# Patient Record
Sex: Female | Born: 1990 | Race: White | Hispanic: No | Marital: Married | Smoking: Never smoker
Health system: Southern US, Community
[De-identification: ages and names within clinical notes are randomized; demographics above are authoritative.]

## PROBLEM LIST (undated history)

## (undated) HISTORY — PX: TUBAL LIGATION: SHX77

## (undated) HISTORY — PX: APPENDECTOMY: SHX54

## (undated) HISTORY — PX: DILATION AND CURETTAGE OF UTERUS: SHX78

---

## 2017-08-08 DIAGNOSIS — Z302 Encounter for sterilization: Secondary | ICD-10-CM | POA: Diagnosis not present

## 2017-09-25 ENCOUNTER — Encounter (HOSPITAL_COMMUNITY): Payer: Self-pay | Admitting: *Deleted

## 2017-09-25 ENCOUNTER — Other Ambulatory Visit: Payer: Self-pay

## 2017-09-25 ENCOUNTER — Emergency Department (HOSPITAL_COMMUNITY)
Admission: EM | Admit: 2017-09-25 | Discharge: 2017-09-25 | Disposition: A | Discharge status: Home / Self Care | Payer: Medicaid Other | Attending: Emergency Medicine | Admitting: Emergency Medicine

## 2017-09-25 DIAGNOSIS — Y93G1 Activity, food preparation and clean up: Secondary | ICD-10-CM | POA: Diagnosis not present

## 2017-09-25 DIAGNOSIS — W260XXA Contact with knife, initial encounter: Secondary | ICD-10-CM | POA: Diagnosis not present

## 2017-09-25 DIAGNOSIS — S61215A Laceration without foreign body of left ring finger without damage to nail, initial encounter: Secondary | ICD-10-CM | POA: Diagnosis present

## 2017-09-25 DIAGNOSIS — Y92009 Unspecified place in unspecified non-institutional (private) residence as the place of occurrence of the external cause: Secondary | ICD-10-CM | POA: Insufficient documentation

## 2017-09-25 DIAGNOSIS — Y999 Unspecified external cause status: Secondary | ICD-10-CM | POA: Insufficient documentation

## 2017-09-25 MED ORDER — POVIDONE-IODINE 10 % EX SOLN
CUTANEOUS | Status: AC
  Filled 2017-09-25: qty 15

## 2017-09-25 MED ORDER — LIDOCAINE HCL (PF) 2 % IJ SOLN: 2.0000 mL | Freq: Once | INTRAMUSCULAR | Status: DC

## 2017-09-25 MED ORDER — LIDOCAINE HCL (PF) 2 % IJ SOLN
INTRAMUSCULAR | Status: AC
  Filled 2017-09-25: qty 20

## 2017-09-25 NOTE — ED Triage Notes (Signed)
Pt reports opening a package of meat with a knife at home and cut her left ring finger. Pt has a small 1/2 inch cut to that finger.

## 2017-09-25 NOTE — ED Notes (Signed)
telfa and kerlix applied to wound; pt instructed on how to keep clean and dry

## 2017-09-25 NOTE — Discharge Instructions (Signed)
Have your sutures removed in 10 days.  Keep your wound clean and dry,  Until a good scab forms - you may then wash gently twice daily with mild soap and water, but dry completely after.  Get rechecked for any sign of infection (redness,  Swelling,  Increased pain or drainage of purulent fluid). ° °

## 2017-09-27 NOTE — ED Provider Notes (Signed)
[facility] EMERGENCY DEPARTMENT Provider Note   CSN: [id number] Arrival date & time: 09/25/17  2042     History   Chief Complaint Chief Complaint  Patient presents with  . finger laceration    HPI Isabella Little is a 26 y.o. female.  The history is provided by the patient.  Laceration   The incident occurred less than 1 hour ago. The laceration is located on the right hand and left hand. The laceration is 1 cm in size. Injury mechanism: using a knife to open a package of steak. The pain is mild. The pain has been constant since onset. She reports no foreign bodies present. Her tetanus status is UTD.    History reviewed. No pertinent past medical history.  There are no active problems to display for this patient.   Past Surgical History:  Procedure Laterality Date  . APPENDECTOMY    . DILATION AND CURETTAGE OF UTERUS    . TUBAL LIGATION      OB History    No data available       Home Medications    Prior to Admission medications   Not on File    Family History No family history on file.  Social History Social History   Tobacco Use  . Smoking status: Never Smoker  . Smokeless tobacco: Never Used  Substance Use Topics  . Alcohol use: No    Frequency: Never  . Drug use: No     Allergies   Patient has no known allergies.   Review of Systems Review of Systems  Constitutional: Negative for chills and fever.  HENT: Negative.   Respiratory: Negative.   Cardiovascular: Negative.   Gastrointestinal: Negative.   Musculoskeletal: Negative for arthralgias.  Skin: Positive for wound.  Neurological: Negative for weakness and numbness.     Physical Exam Updated Vital Signs BP (!) 146/98 (BP Location: Right Arm)   Pulse 61   Temp 97.9 F (36.6 C) (Oral)   Resp 18   Ht 5\' 2"  (1.575 m)   Wt 72.6 kg (160 lb)   LMP 09/18/2017   SpO2 97%   BMI 29.26 kg/m   Physical Exam  Constitutional: She is oriented to person, place, and time. She  appears well-developed and well-nourished.  HENT:  Head: Normocephalic.  Cardiovascular: Normal rate.  Pulmonary/Chest: Effort normal.  Musculoskeletal: She exhibits no tenderness.  Neurological: She is alert and oriented to person, place, and time. No sensory deficit.  Skin: Laceration noted.  1 cm superficial laceration, hemostatic, left right finger, dorsal middle phalanx, well approximated and hemostatic.     ED Treatments / Results  Labs (all labs ordered are listed, but only abnormal results are displayed) Labs Reviewed - No data to display  EKG  EKG Interpretation None       Radiology No results found.  Procedures Procedures (including critical care time)  LACERATION REPAIR Performed by: Burgess AmorIDOL, Adilynne Fitzwater Authorized by: Burgess AmorIDOL, Abdulahi Schor Consent: Verbal consent obtained. Risks and benefits: risks, benefits and alternatives were discussed Consent given by: patient Patient identity confirmed: provided demographic data Prepped and Draped in normal sterile fashion Wound explored  Laceration Location: left ring finger  Laceration Length: 1 cm  No Foreign Bodies seen or palpated  Anesthesia: digital block Local anesthetic: lidocaine 2% without epinephrine  Anesthetic total: 1 ml  Irrigation method: syringe Amount of cleaning: standard  Skin closure: ethilon 4-0  Number of sutures: 3  Technique: simple interrupted.  Patient tolerance: Patient tolerated the procedure well with  no immediate complications.   Medications Ordered in ED Medications - No data to display   Initial Impression / Assessment and Plan / ED Course  I have reviewed the triage vital signs and the nursing notes.  Pertinent labs & imaging results that were available during my care of the patient were reviewed by me and considered in my medical decision making (see chart for details).     Wound care instructions given.  Pt advised to have sutures removed in 10 days,  Return here sooner for  any signs of infection including redness, swelling, worse pain or drainage of pus.     Final Clinical Impressions(s) / ED Diagnoses   Final diagnoses:  Laceration of left ring finger without foreign body without damage to nail, initial encounter    ED Discharge Orders    None       Victoriano Laindol, Melondy Blanchard, PA-C 09/27/17 2346    Doug SouJacubowitz, Sam, MD 09/29/17 [id number]

## 2017-11-24 ENCOUNTER — Ambulatory Visit: Payer: Medicaid Other | Admitting: Family

## 2017-11-24 ENCOUNTER — Encounter: Payer: Self-pay | Admitting: Family

## 2017-11-24 VITALS — BP 117/83 | HR 77 | Temp 97.5°F | Ht 62.0 in | Wt 150.2 lb

## 2017-11-24 DIAGNOSIS — R059 Cough, unspecified: Secondary | ICD-10-CM

## 2017-11-24 DIAGNOSIS — J069 Acute upper respiratory infection, unspecified: Secondary | ICD-10-CM | POA: Diagnosis not present

## 2017-11-24 DIAGNOSIS — R6889 Other general symptoms and signs: Secondary | ICD-10-CM

## 2017-11-24 DIAGNOSIS — R05 Cough: Secondary | ICD-10-CM

## 2017-11-24 LAB — CULTURE, GROUP A STREP

## 2017-11-24 LAB — VERITOR FLU A/B WAIVED
Influenza A: NEGATIVE
Influenza B: NEGATIVE

## 2017-11-24 LAB — RAPID STREP SCREEN (MED CTR MEBANE ONLY): Strep Gp A Ag, IA W/Reflex: NEGATIVE

## 2017-11-24 MED ORDER — PREDNISONE 10 MG (21) PO TBPK: ORAL_TABLET | ORAL | 0 refills | Status: DC

## 2017-11-24 MED ORDER — FLUTICASONE PROPIONATE 50 MCG/ACT NA SUSP: 2.0000 | Freq: Every day | NASAL | 6 refills | Status: DC

## 2017-11-24 NOTE — Progress Notes (Signed)
Subjective:    Patient ID: Isabella Little, female    DOB: [birth year], 27 y.o.   MRN: [id number]  PT presents to the office today to establish care and complaints of cough.  Cough  This is a new problem. The current episode started in the past 7 days. The problem has been waxing and waning. The problem occurs every few minutes. The cough is non-productive. Associated symptoms include ear pain, headaches and a sore throat. Pertinent negatives include no chills, ear congestion, fever or myalgias. She has tried rest for the symptoms. The treatment provided mild relief. There is no history of asthma or COPD.      Review of Systems  Constitutional: Negative for chills and fever.  HENT: Positive for ear pain and sore throat.   Respiratory: Positive for cough.   Musculoskeletal: Negative for myalgias.  Neurological: Positive for headaches.  All other systems reviewed and are negative.  Family History  Problem Relation Age of Onset  . Stroke Father   . Heart disease Father        Triple by-pass  . Diabetes Maternal Grandmother   . Heart disease Maternal Grandmother   . Cancer Maternal Grandmother        Skin  . Birth defects Maternal Grandmother   . Cancer Paternal Grandmother        breast    Social History   Socioeconomic History  . Marital status: Married    Spouse name: None  . Number of children: None  . Years of education: None  . Highest education level: None  Social Needs  . Financial resource strain: None  . Food insecurity - worry: None  . Food insecurity - inability: None  . Transportation needs - medical: None  . Transportation needs - non-medical: None  Occupational History  . None  Tobacco Use  . Smoking status: Never Smoker  . Smokeless tobacco: Never Used  Substance and Sexual Activity  . Alcohol use: No    Frequency: Never  . Drug use: No  . Sexual activity: Yes  Other Topics Concern  . None  Social History Narrative  . None         Objective:   Physical Exam  Constitutional: She is oriented to person, place, and time. She appears well-developed and well-nourished. No distress.  HENT:  Head: Normocephalic and atraumatic.  Right Ear: External ear normal.  Nose: Mucosal edema and rhinorrhea present.  Mouth/Throat: Posterior oropharyngeal edema and posterior oropharyngeal erythema present.  Eyes: Pupils are equal, round, and reactive to light.  Neck: Normal range of motion. Neck supple. No thyromegaly present.  Cardiovascular: Normal rate, regular rhythm, normal heart sounds and intact distal pulses.  No murmur heard. Pulmonary/Chest: Effort normal and breath sounds normal. No respiratory distress. She has no wheezes.  Abdominal: Soft. Bowel sounds are normal. She exhibits no distension. There is no tenderness.  Musculoskeletal: Normal range of motion. She exhibits no edema or tenderness.  Neurological: She is alert and oriented to person, place, and time.  Skin: Skin is warm and dry.  Psychiatric: She has a normal mood and affect. Her behavior is normal. Judgment and thought content normal.  Vitals reviewed.     BP 117/83   Pulse 77   Temp (!) 97.5 F (36.4 C) (Oral)   Ht 5\' 2"  (1.575 m)   Wt 150 lb 3.2 oz (68.1 kg)   BMI 27.47 kg/m      Assessment & Plan:  1. Cough - Veritor Flu  A/B Waived  2. Flu-like symptoms - Veritor Flu A/B Waived - Rapid Strep Screen (Not at [facility])  3. Viral upper respiratory tract infection - Take meds as prescribed - Use a cool mist humidifier  -Use saline nose sprays frequently -Force fluids -For any cough or congestion  Use plain Mucinex- regular strength or max strength is fine -For fever or aces or pains- take tylenol or ibuprofen appropriate for age and weight. -Throat lozenges if help -New toothbrush in 3 days - fluticasone (FLONASE) 50 MCG/ACT nasal spray; Place 2 sprays into both nostrils daily.  Dispense: 16 g; Refill: 6 - predniSONE (STERAPRED UNI-PAK 21 TAB)  10 MG (21) TBPK tablet; Use as directed  Dispense: 21 tablet; Refill: 0    Jannifer Rodney, FNP

## 2017-11-24 NOTE — Patient Instructions (Signed)
Upper Respiratory Infection, Adult Most upper respiratory infections (URIs) are caused by a virus. A URI affects the nose, throat, and upper air passages. The most common type of URI is often called "the common cold." Follow these instructions at home:  Take medicines only as told by your doctor.  Gargle warm saltwater or take cough drops to comfort your throat as told by your doctor.  Use a warm mist humidifier or inhale steam from a shower to increase air moisture. This may make it easier to breathe.  Drink enough fluid to keep your pee (urine) clear or pale yellow.  Eat soups and other clear broths.  Have a healthy diet.  Rest as needed.  Go back to work when your fever is gone or your doctor says it is okay. ? You may need to stay home longer to avoid giving your URI to others. ? You can also wear a face mask and wash your hands often to prevent spread of the virus.  Use your inhaler more if you have asthma.  Do not use any tobacco products, including cigarettes, chewing tobacco, or electronic cigarettes. If you need help quitting, ask your doctor. Contact a doctor if:  You are getting worse, not better.  Your symptoms are not helped by medicine.  You have chills.  You are getting more short of breath.  You have brown or red mucus.  You have yellow or brown discharge from your nose.  You have pain in your face, especially when you bend forward.  You have a fever.  You have puffy (swollen) neck glands.  You have pain while swallowing.  You have white areas in the back of your throat. Get help right away if:  You have very bad or constant: ? Headache. ? Ear pain. ? Pain in your forehead, behind your eyes, and over your cheekbones (sinus pain). ? Chest pain.  You have long-lasting (chronic) lung disease and any of the following: ? Wheezing. ? Long-lasting cough. ? Coughing up blood. ? A change in your usual mucus.  You have a stiff neck.  You have  changes in your: ? Vision. ? Hearing. ? Thinking. ? Mood. This information is not intended to replace advice given to you by your health care provider. Make sure you discuss any questions you have with your health care provider. Document Released: 03/15/2008 Document Revised: 05/30/2016 Document Reviewed: 01/02/2014 Elsevier Interactive Patient Education  2018 Elsevier Inc.  

## 2017-12-15 ENCOUNTER — Ambulatory Visit: Payer: Medicaid Other | Admitting: Family Medicine

## 2017-12-15 ENCOUNTER — Encounter: Payer: Self-pay | Admitting: Family Medicine

## 2017-12-15 VITALS — BP 115/78 | HR 57 | Temp 97.8°F | Wt 154.2 lb

## 2017-12-15 DIAGNOSIS — J029 Acute pharyngitis, unspecified: Secondary | ICD-10-CM

## 2017-12-15 LAB — RAPID STREP SCREEN (MED CTR MEBANE ONLY): STREP GP A AG, IA W/REFLEX: NEGATIVE

## 2017-12-15 LAB — CULTURE, GROUP A STREP

## 2017-12-15 MED ORDER — MAGIC MOUTHWASH W/LIDOCAINE: ORAL | 0 refills | Status: DC

## 2017-12-15 NOTE — Progress Notes (Signed)
Subjective: CC: URI symptoms PCP: Junie Spencer, FNP [id number] Isabella Little is a 27 y.o. female presenting to clinic today for:  1. URI Patient was seen on 11/24/2017 for a 7-day history of nonproductive cough, ear pain, headache and sore throat.  She was thought to have a viral URI and was discharged with Flonase and a prednisone Dosepak.  She returns today and notes that she has had persistent and worsening sore throat since last visit.  She notes that congestion and headache improved after the prednisone.  Denies nausea, vomiting, fevers, chills, shortness of breath, wheeze, rash.  She has been using cough drops only.  No oral NSAIDs, salt water gargles or Tylenol.  She reports it hurts to swallow and it feels like she is "torn something in her throat".  Denies bleeding or hemoptysis.  Has mild cough that is nonproductive.  No known sick contacts.  She reports that she is "sensitive to medications" and falls asleep easily with even aspirin.   ROS: Per HPI  No Known Allergies History reviewed. No pertinent past medical history.  Current Outpatient Medications:  .  fluticasone (FLONASE) 50 MCG/ACT nasal spray, Place 2 sprays into both nostrils daily., Disp: 16 g, Rfl: 6 Social History   Socioeconomic History  . Marital status: Married    Spouse name: Not on file  . Number of children: Not on file  . Years of education: Not on file  . Highest education level: Not on file  Social Needs  . Financial resource strain: Not on file  . Food insecurity - worry: Not on file  . Food insecurity - inability: Not on file  . Transportation needs - medical: Not on file  . Transportation needs - non-medical: Not on file  Occupational History  . Not on file  Tobacco Use  . Smoking status: Never Smoker  . Smokeless tobacco: Never Used  Substance and Sexual Activity  . Alcohol use: No    Frequency: Never  . Drug use: No  . Sexual activity: Yes  Other Topics Concern  . Not on file  Social  History Narrative  . Not on file   Family History  Problem Relation Age of Onset  . Stroke Father   . Heart disease Father        Triple by-pass  . Diabetes Maternal Grandmother   . Heart disease Maternal Grandmother   . Cancer Maternal Grandmother        Skin  . Birth defects Maternal Grandmother   . Cancer Paternal Grandmother        breast    Objective: Office vital signs reviewed. BP 115/78   Pulse (!) 57   Temp 97.8 F (36.6 C) (Oral)   Wt 154 lb 3.2 oz (69.9 kg)   BMI 28.20 kg/m   Physical Examination:  General: Awake, alert, well nourished, somewhat syndromic facies, No acute distress HEENT: Normal    Neck: No masses palpated. No lymphadenopathy    Ears: Tympanic membranes intact, normal light reflex, no erythema, no bulging    Eyes: PERRLA, extraocular membranes intact, sclera white, no ocular discharge    Nose: nasal turbinates moist, clear nasal discharge    Throat: moist mucus membranes, moderate oropharyngeal erythema, grade 2 tonsils with no tonsillar exudate.  Airway is patent Cardio: regular rate and rhythm, S1S2 heard, no murmurs appreciated Pulm: clear to auscultation bilaterally, no wheezes, rhonchi or rales; normal work of breathing on room air Skin: No rash.  Assessment/ Plan: 27 y.o. female  1. Sore throat Patient afebrile with normal vital signs.  She is nontoxic appearing.  Her physical exam is remarkable for moderate oropharyngeal erythema with grade 2 tonsils.  Otherwise normal-appearing exam.  Rapid strep was negative.  Strep culture sent.  This may be a persistent viral pharyngitis.  I prescribed her Magic mouthwash with lidocaine to swish and spit every 6 hours as needed for sore throat.  If no improvement, could consider trial of PPI?  Reflux induced pharyngitis.  Would obtain HIV screening, as an HIV infection can sometimes present as a pharyngitis.  However, she has no associated rash to make this high on the differential at this point.   Could also consider referral to ear nose and throat for further evaluation considering she feels like her "throat is torn up".  Home care instructions reviewed with patient.  She will follow-up as needed. - Culture, Group A Strep - Rapid Strep Screen (Not at [facility])   Orders Placed This Encounter  Procedures  . Culture, Group A Strep    Order Specific Question:   Source    Answer:   throat  . Rapid Strep Screen (Not at [facility])   Meds ordered this encounter  Medications  . magic mouthwash w/lidocaine SOLN    Sig: Gargle and spit out 10 mL every 6 hours as needed for sore throat.    Dispense:  240 mL    Refill:  0     Eulonda Andalon Hulen SkainsM Zerah Hilyer, DO [facility] [phone]

## 2017-12-15 NOTE — Patient Instructions (Addendum)
I prescribed you a mouthwash to use every 6 hours as needed for sore throat.  You should swish and spit this out.  If you do not find that this improves her symptoms, please return for reevaluation.  You may need to see an ear nose and throat doctor.  Pharyngitis Pharyngitis is a sore throat (pharynx). There is redness, pain, and swelling of your throat. Follow these instructions at home:  Drink enough fluids to keep your pee (urine) clear or pale yellow.  Only take medicine as told by your doctor. ? You may get sick again if you do not take medicine as told. Finish your medicines, even if you start to feel better. ? Do not take aspirin.  Rest.  Rinse your mouth (gargle) with salt water ( tsp of salt per 1 qt of water) every 1-2 hours. This will help the pain.  If you are not at risk for choking, you can suck on hard candy or sore throat lozenges. Contact a doctor if:  You have large, tender lumps on your neck.  You have a rash.  You cough up green, yellow-brown, or bloody spit. Get help right away if:  You have a stiff neck.  You drool or cannot swallow liquids.  You throw up (vomit) or are not able to keep medicine or liquids down.  You have very bad pain that does not go away with medicine.  You have problems breathing (not from a stuffy nose). This information is not intended to replace advice given to you by your health care provider. Make sure you discuss any questions you have with your health care provider. Document Released: 03/15/2008 Document Revised: 03/04/2016 Document Reviewed: 06/04/2013 Elsevier Interactive Patient Education  2017 [employer]

## 2017-12-19 LAB — CULTURE, GROUP A STREP

## 2018-06-15 DIAGNOSIS — N86 Erosion and ectropion of cervix uteri: Secondary | ICD-10-CM | POA: Diagnosis not present

## 2018-07-17 ENCOUNTER — Encounter (HOSPITAL_COMMUNITY): Payer: Self-pay | Admitting: *Deleted

## 2018-07-17 ENCOUNTER — Other Ambulatory Visit: Payer: Self-pay

## 2018-07-17 ENCOUNTER — Emergency Department (HOSPITAL_COMMUNITY): Payer: Medicaid Other

## 2018-07-17 ENCOUNTER — Emergency Department (HOSPITAL_COMMUNITY)
Admission: EM | Admit: 2018-07-17 | Discharge: 2018-07-18 | Disposition: A | Discharge status: Home / Self Care | Payer: Medicaid Other | Attending: Emergency Medicine | Admitting: Emergency Medicine

## 2018-07-17 DIAGNOSIS — S0083XA Contusion of other part of head, initial encounter: Secondary | ICD-10-CM | POA: Diagnosis not present

## 2018-07-17 DIAGNOSIS — S0993XA Unspecified injury of face, initial encounter: Secondary | ICD-10-CM | POA: Diagnosis not present

## 2018-07-17 DIAGNOSIS — K0889 Other specified disorders of teeth and supporting structures: Secondary | ICD-10-CM | POA: Diagnosis not present

## 2018-07-17 DIAGNOSIS — Y999 Unspecified external cause status: Secondary | ICD-10-CM | POA: Diagnosis not present

## 2018-07-17 DIAGNOSIS — Y9389 Activity, other specified: Secondary | ICD-10-CM | POA: Diagnosis not present

## 2018-07-17 DIAGNOSIS — Y929 Unspecified place or not applicable: Secondary | ICD-10-CM | POA: Diagnosis not present

## 2018-07-17 DIAGNOSIS — W109XXA Fall (on) (from) unspecified stairs and steps, initial encounter: Secondary | ICD-10-CM | POA: Insufficient documentation

## 2018-07-17 MED ORDER — IBUPROFEN 400 MG PO TABS
400.0000 mg | ORAL_TABLET | Freq: Once | ORAL | Status: AC
  Administered 2018-07-18: 400 mg via ORAL
  Filled 2018-07-17: qty 1

## 2018-07-17 MED ORDER — ACETAMINOPHEN 325 MG PO TABS
650.0000 mg | ORAL_TABLET | Freq: Once | ORAL | Status: AC
  Administered 2018-07-18: 650 mg via ORAL
  Filled 2018-07-17: qty 2

## 2018-07-17 NOTE — ED Provider Notes (Signed)
[facility] EMERGENCY DEPARTMENT Provider Note   CSN: [id number] Arrival date & time: 07/17/18  2306  Time seen 23:31 PM   History   Chief Complaint Chief Complaint  Patient presents with  . Facial Pain    HPI Cordell Coke is a 27 y.o. female.  HPI patient states about 3:45 PM today she was cleaning her house and she was at the top of a flight of stairs of about 15 steps.  She states she has a bad right knee and it gave out on her and she tumbled down the steps.  She states she landed with her right leg between 2 spindles on the stair rail and states she hit her left side of face and she was tumbling down the stairs.  She ended up laying on her back when she stopped falling.  She did not have loss of consciousness.  She states since that time she now has pain in her left face and she also has some pain with cold and heat since the accident in a left upper tooth.  She does not have a dentist.  She denies any other injuries from the fall.  She states she took 1 ibuprofen 200 mg.  She states she stayed where she was until her husband got home from work at about 4:30 PM.  PCP Junie Spencer, FNP PCP [facility]  History reviewed. No pertinent past medical history.  There are no active problems to display for this patient.   Past Surgical History:  Procedure Laterality Date  . APPENDECTOMY    . DILATION AND CURETTAGE OF UTERUS    . TUBAL LIGATION       OB History   None      Home Medications    Prior to Admission medications   Medication Sig Start Date End Date Taking? Authorizing Provider  ibuprofen (ADVIL,MOTRIN) 200 MG tablet Take 200 mg by mouth every 6 (six) hours as needed for mild pain or moderate pain.   Yes [provider]  penicillin v potassium (VEETID) 500 MG tablet Take 1 tablet (500 mg total) by mouth 4 (four) times daily for 7 days. 07/18/18 07/25/18  Devoria Albe, MD    Family History Family History  Problem Relation Age of Onset  .  Stroke Father   . Heart disease Father        Triple by-pass  . Diabetes Maternal Grandmother   . Heart disease Maternal Grandmother   . Cancer Maternal Grandmother        Skin  . Birth defects Maternal Grandmother   . Cancer Paternal Grandmother        breast    Social History Social History   Tobacco Use  . Smoking status: Never Smoker  . Smokeless tobacco: Never Used  Substance Use Topics  . Alcohol use: No    Frequency: Never  . Drug use: No  lives with spouse and 2 small children.   Allergies   Patient has no known allergies.   Review of Systems Review of Systems  All other systems reviewed and are negative.    Physical Exam Updated Vital Signs BP (!) 144/115 (BP Location: Left Arm)   Pulse 77   Temp 98.5 F (36.9 C) (Oral)   Resp 18   Ht 5' 2.5" (1.588 m)   Wt 61.2 kg   LMP 07/03/2018   SpO2 97%   BMI 24.30 kg/m   Vital signs normal    Physical Exam  Constitutional: She  is oriented to person, place, and time. She appears well-developed and well-nourished.  HENT:  Head: Normocephalic and atraumatic.  Right Ear: External ear normal.  Left Ear: External ear normal.  Nose: Nose normal.  Mouth/Throat:    Pt has dental crowding and her "eye" teeth are out of alignment. She has diffuse tarter present. She is able to open her jaw well and her jaw is nontender to palpation. She is tender to palpation over the left maxillary sinus.   Eyes: Pupils are equal, round, and reactive to light. Conjunctivae and EOM are normal.  Neck: Normal range of motion.  Cardiovascular: Normal rate.  Pulmonary/Chest: Effort normal.  Musculoskeletal: Normal range of motion. She exhibits no deformity.  Neurological: She is alert and oriented to person, place, and time. No cranial nerve deficit.  Skin: Skin is warm and dry.  Psychiatric: She has a normal mood and affect. Her behavior is normal. Thought content normal.  Nursing note and vitals reviewed.    ED Treatments  / Results  Labs (all labs ordered are listed, but only abnormal results are displayed) Labs Reviewed - No data to display  EKG None  Radiology Ct Maxillofacial Wo Cm  Result Date: 07/18/2018 CLINICAL DATA:  Fall down stairs with facial trauma. EXAM: CT MAXILLOFACIAL WITHOUT CONTRAST TECHNIQUE: Multidetector CT imaging of the maxillofacial structures was performed. Multiplanar CT image reconstructions were also generated. COMPARISON:  None. FINDINGS: Osseous: --Complex facial fracture types: No LeFort, zygomaticomaxillary complex or nasoorbitoethmoidal fracture. --Simple fracture types: None. --Mandible, hard palate and teeth: Mallet occlusion of tooth 7 relative to tooth 23, of indeterminate chronicity. No fracture. Orbits: The globes are intact. Normal appearance of the intra- and extraconal fat. Symmetric extraocular muscles. Sinuses: No fluid levels or advanced mucosal thickening. Soft tissues: Normal visualized extracranial soft tissues. Limited intracranial: Normal. IMPRESSION: No facial fracture or other acute abnormality. Electronically Signed   By: Deatra Robinson M.D.   On: 07/18/2018 00:44    Procedures Procedures (including critical care time)  Medications Ordered in ED Medications  penicillin v potassium (VEETID) tablet 500 mg (has no administration in time range)  ibuprofen (ADVIL,MOTRIN) tablet 400 mg (400 mg Oral Given 07/18/18 0037)  acetaminophen (TYLENOL) tablet 650 mg (650 mg Oral Given 07/18/18 0037)     Initial Impression / Assessment and Plan / ED Course  I have reviewed the triage vital signs and the nursing notes.  Pertinent labs & imaging results that were available during my care of the patient were reviewed by me and considered in my medical decision making (see chart for details).     Patient was given ibuprofen and acetaminophen for pain.  CT maxillofacial without contrast was ordered to make sure she did not have an underlying fracture around her maxillary  sinus.  Patient was given the information about her CT scan.  She needs to follow-up with an dentist for her tooth.  Final Clinical Impressions(s) / ED Diagnoses   Final diagnoses:  Tooth ache  Contusion of face, initial encounter    ED Discharge Orders         Ordered    penicillin v potassium (VEETID) 500 MG tablet  4 times daily     07/18/18 0102        OTC ibuprofen and acetaminophen  Plan discharge  Devoria Albe, MD, Concha Pyo, MD 07/18/18 [phone]

## 2018-07-17 NOTE — ED Triage Notes (Signed)
Pt states she fell down the stairs earlier today landing on the left side of her face; pt states she may have "popped" out a filling

## 2018-07-18 DIAGNOSIS — S0993XA Unspecified injury of face, initial encounter: Secondary | ICD-10-CM | POA: Diagnosis not present

## 2018-07-18 MED ORDER — PENICILLIN V POTASSIUM 250 MG PO TABS
ORAL_TABLET | ORAL | Status: AC
  Filled 2018-07-18: qty 2

## 2018-07-18 MED ORDER — PENICILLIN V POTASSIUM 500 MG PO TABS: 500.0000 mg | ORAL_TABLET | Freq: Four times a day (QID) | ORAL | 0 refills | Status: AC

## 2018-07-18 MED ORDER — PENICILLIN V POTASSIUM 250 MG PO TABS
500.0000 mg | ORAL_TABLET | Freq: Once | ORAL | Status: AC
  Administered 2018-07-18: 500 mg via ORAL

## 2018-07-18 NOTE — Discharge Instructions (Addendum)
Use heat for comfort. Take ibuprofen 600 mg + acetaminophen 650 mg 4 times a day for pain. Please try to find a dentist to get definitive care of your tooth.

## 2018-07-18 NOTE — ED Notes (Signed)
Pt ambulatory to waiting room. Pt verbalized understanding of discharge instructions.   

## 2018-07-19 ENCOUNTER — Encounter: Payer: Self-pay | Admitting: Family Medicine

## 2018-07-19 ENCOUNTER — Ambulatory Visit (INDEPENDENT_AMBULATORY_CARE_PROVIDER_SITE_OTHER): Payer: Medicaid Other | Admitting: Family Medicine

## 2018-07-19 VITALS — BP 124/89 | HR 76 | Temp 98.2°F | Ht 62.5 in | Wt 150.0 lb

## 2018-07-19 DIAGNOSIS — K0889 Other specified disorders of teeth and supporting structures: Secondary | ICD-10-CM | POA: Diagnosis not present

## 2018-07-19 MED ORDER — NAPROXEN 500 MG PO TABS: 500.0000 mg | ORAL_TABLET | Freq: Two times a day (BID) | ORAL | 0 refills | Status: AC

## 2018-07-19 NOTE — Patient Instructions (Signed)
Dental Pain Dental pain may be caused by many things, including:  Tooth decay (cavities or caries). Cavities cause the nerve of your tooth to be open to air and hot or cold temperatures. This can cause pain or discomfort.  Abscess or infection. A dental abscess is an area that is full of infected pus from a bacterial infection in the inner part of the tooth (pulp). It usually happens at the end of the tooth's root.  Injury.  An unknown reason (idiopathic).  Your pain may be mild or severe. It may only happen when:  You are chewing.  You are exposed to hot or cold temperature.  You are eating or drinking sugary foods or beverages, such as: ? Soda. ? Candy.  Your pain may also be there all of the time. Follow these instructions at home: Watch your dental pain for any changes. Do these things to lessen your discomfort:  Take medicines only as told by your dentist.  If your dentist tells you to take an antibiotic medicine, finish all of it even if you start to feel better.  Keep all follow-up visits as told by your dentist. This is important.  Do not apply heat to the outside of your face.  Rinse your mouth or gargle with salt water if told by your dentist. This helps with pain and swelling. ? You can make salt water by adding  tsp of salt to 1 cup of warm water.  Apply ice to the painful area of your face: ? Put ice in a plastic bag. ? Place a towel between your skin and the bag. ? Leave the ice on for 20 minutes, 2-3 times per day.  Avoid foods or drinks that cause you pain, such as: ? Very hot or very cold foods or drinks. ? Sweet or sugary foods or drinks.  Contact a doctor if:  Your pain is not helped with medicines.  Your symptoms are worse.  You have new symptoms. Get help right away if:  You cannot open your mouth.  You are having trouble breathing or swallowing.  You have a fever.  Your face, neck, or jaw is puffy (swollen). This information is not  intended to replace advice given to you by your health care provider. Make sure you discuss any questions you have with your health care provider. Document Released: 03/15/2008 Document Revised: 03/04/2016 Document Reviewed: 09/23/2014 Elsevier Interactive Patient Education  2018 [employer]  

## 2018-07-19 NOTE — Progress Notes (Signed)
Subjective:    Patient ID: Isabella Little, female    DOB: 1991/01/25, 27 y.o.   MRN: [id number]  Chief Complaint:  Dental pain, upper left jaw (fall on 07/17/18, went to ER with facial pain, had CT scan; reports part of tooth broken off; has been taking 650 of Acetaminophen and 600 mg of Ibuprofen together but does not help with pain; patient does not have dentist, was given list of dentist at ER, has appointment scheduled for 08/03/18 )   HPI: Isabella Little is a 27 y.o. female presenting on 07/19/2018 for Dental pain, upper left jaw (fall on 07/17/18, went to ER with facial pain, had CT scan; reports part of tooth broken off; has been taking 650 of Acetaminophen and 600 mg of Ibuprofen together but does not help with pain; patient does not have dentist, was given list of dentist at ER, has appointment scheduled for 08/03/18 )  Pt presents today for ongoing dental pain. Pt was seen in the ED on 07/17/18. Pt had a fall that resulted in dental pain. CT scan was completed and ruled out underlying fracture of facial bones. Pt states she has continued left upper dental pain. States 8/10 and throbbing in nature. Pt states she is taking the ibuprofen, tylenol, and pen v as prescribed. States she has a dental appointment scheduled for 08/03/18.   Relevant past medical, surgical, family, and social history reviewed and updated as indicated.  Allergies and medications reviewed and updated.   History reviewed. No pertinent past medical history.  Past Surgical History:  Procedure Laterality Date  . APPENDECTOMY    . DILATION AND CURETTAGE OF UTERUS    . TUBAL LIGATION      Social History   Socioeconomic History  . Marital status: Married    Spouse name: Not on file  . Number of children: Not on file  . Years of education: Not on file  . Highest education level: Not on file  Occupational History  . Not on file  Social Needs  . Financial resource strain: Not on file  . Food  insecurity:    Worry: Not on file    Inability: Not on file  . Transportation needs:    Medical: Not on file    Non-medical: Not on file  Tobacco Use  . Smoking status: Never Smoker  . Smokeless tobacco: Never Used  Substance and Sexual Activity  . Alcohol use: No    Frequency: Never  . Drug use: No  . Sexual activity: Yes  Lifestyle  . Physical activity:    Days per week: Not on file    Minutes per session: Not on file  . Stress: Not on file  Relationships  . Social connections:    Talks on phone: Not on file    Gets together: Not on file    Attends religious service: Not on file    Active member of club or organization: Not on file    Attends meetings of clubs or organizations: Not on file    Relationship status: Not on file  . Intimate partner violence:    Fear of current or ex partner: Not on file    Emotionally abused: Not on file    Physically abused: Not on file    Forced sexual activity: Not on file  Other Topics Concern  . Not on file  Social History Narrative  . Not on file    Outpatient Encounter Medications as of 07/19/2018  Medication Sig  .  acetaminophen (TYLENOL) 325 MG tablet Take 650 mg by mouth every 6 (six) hours as needed.  . penicillin v potassium (VEETID) 500 MG tablet Take 1 tablet (500 mg total) by mouth 4 (four) times daily for 7 days.  . [DISCONTINUED] ibuprofen (ADVIL,MOTRIN) 200 MG tablet Take 200 mg by mouth every 6 (six) hours as needed for mild pain or moderate pain.  . naproxen (NAPROSYN) 500 MG tablet Take 1 tablet (500 mg total) by mouth 2 (two) times daily with a meal.   No facility-administered encounter medications on file as of 07/19/2018.     No Known Allergies  Review of Systems  Constitutional: Positive for appetite change. Negative for chills and fatigue.  HENT: Positive for dental problem.   Gastrointestinal: Negative for nausea and vomiting.  Neurological: Negative for dizziness, weakness, light-headedness and headaches.    All other systems reviewed and are negative.       Objective:    BP 124/89   Pulse 76   Temp 98.2 F (36.8 C) (Oral)   Ht 5' 2.5" (1.588 m)   Wt 150 lb (68 kg)   LMP 07/03/2018   BMI 27.00 kg/m    Wt Readings from Last 3 Encounters:  07/19/18 150 lb (68 kg)  07/17/18 135 lb (61.2 kg)  12/15/17 154 lb 3.2 oz (69.9 kg)    Physical Exam  Constitutional: She is oriented to person, place, and time. She appears well-developed and well-nourished. No distress.  HENT:  Mouth/Throat: Uvula is midline, oropharynx is clear and moist and mucous membranes are normal.    Multiple dental caries. No swelling, erythema, or drainage from gums.   Cardiovascular: Normal rate, regular rhythm and normal heart sounds.  Pulmonary/Chest: Effort normal and breath sounds normal.  Neurological: She is alert and oriented to person, place, and time.  Skin: Skin is warm and dry. Capillary refill takes less than 2 seconds.  Psychiatric: She has a normal mood and affect. Her behavior is normal. Judgment and thought content normal.  Nursing note and vitals reviewed.        Assessment & Plan:  Isabella Little was seen today for dental pain, upper left jaw.  Diagnoses and all orders for this visit:  Pain, dental -     naproxen (NAPROSYN) 500 MG tablet; Take 1 tablet (500 mg total) by mouth 2 (two) times daily with a meal.  Avoid cold or hot liquids. Keep dental appointment. Proper dental hygiene. Orajel as needed. Complete antibiotics. Can get temporary dental filling kit over the counter and place in tooth where filling fell out.     Stop Ibuprofen Continue all other maintenance medications.  Follow up plan: Return if symptoms worsen or fail to improve.  Educational handout given for dental pain  The above assessment and management plan was discussed with the patient. The patient verbalized understanding of and has agreed to the management plan. Patient is aware to call the clinic if symptoms  persist or worsen. Patient is aware when to return to the clinic for a follow-up visit. Patient educated on when it is appropriate to go to the emergency department.   Monia Pouch, FNP-C [facility] [phone]

## 2018-10-02 ENCOUNTER — Ambulatory Visit: Payer: Medicaid Other | Admitting: Family

## 2018-10-02 ENCOUNTER — Encounter: Payer: Self-pay | Admitting: Family

## 2018-10-02 VITALS — BP 132/90 | HR 68 | Temp 97.7°F | Ht 62.5 in | Wt 148.0 lb

## 2018-10-02 DIAGNOSIS — H65191 Other acute nonsuppurative otitis media, right ear: Secondary | ICD-10-CM

## 2018-10-02 MED ORDER — AMOXICILLIN 500 MG PO CAPS: 1000.0000 mg | ORAL_CAPSULE | Freq: Three times a day (TID) | ORAL | 0 refills | Status: DC

## 2018-10-02 NOTE — Progress Notes (Signed)
Subjective:    Patient ID: Isabella Little, female    DOB: May 19, 1991, 27 y.o.   MRN: [id number]  Chief Complaint  Patient presents with  . Ear Pain    right x 2-3 days    Otalgia   There is pain in the right ear. This is a new problem. The current episode started in the past 7 days. The problem occurs every few hours. The problem has been gradually worsening. There has been no fever. The pain is at a severity of 9/10. The pain is moderate. Associated symptoms include coughing, headaches, hearing loss (muffed), rhinorrhea and a sore throat. Pertinent negatives include no ear discharge. She has tried NSAIDs and acetaminophen for the symptoms. The treatment provided mild relief.      Review of Systems  HENT: Positive for ear pain, hearing loss (muffed), rhinorrhea and sore throat. Negative for ear discharge.   Respiratory: Positive for cough.   Neurological: Positive for headaches.  All other systems reviewed and are negative.      Objective:   Physical Exam Vitals signs reviewed.  Constitutional:      General: She is not in acute distress.    Appearance: She is well-developed.  HENT:     Head: Normocephalic and atraumatic.     Right Ear: Swelling present. Tympanic membrane is bulging.     Nose:     Right Turbinates: Enlarged and swollen.     Left Turbinates: Enlarged and swollen.     Mouth/Throat:     Pharynx: Posterior oropharyngeal erythema present.  Eyes:     Pupils: Pupils are equal, round, and reactive to light.  Neck:     Musculoskeletal: Normal range of motion and neck supple.     Thyroid: No thyromegaly.  Cardiovascular:     Rate and Rhythm: Normal rate and regular rhythm.     Heart sounds: Normal heart sounds. No murmur.  Pulmonary:     Effort: Pulmonary effort is normal. No respiratory distress.     Breath sounds: Normal breath sounds. No wheezing.  Abdominal:     General: Bowel sounds are normal. There is no distension.     Palpations: Abdomen is  soft.     Tenderness: There is no abdominal tenderness.  Musculoskeletal: Normal range of motion.        General: No tenderness.  Skin:    General: Skin is warm and dry.  Neurological:     Mental Status: She is alert and oriented to person, place, and time.     Cranial Nerves: No cranial nerve deficit.     Deep Tendon Reflexes: Reflexes are normal and symmetric.  Psychiatric:        Behavior: Behavior normal.        Thought Content: Thought content normal.        Judgment: Judgment normal.       BP 132/90   Pulse 68   Temp 97.7 F (36.5 C) (Oral)   Ht 5' 2.5" (1.588 m)   Wt 148 lb (67.1 kg)   BMI 26.64 kg/m      Assessment & Plan:  Isabella Little comes in today with chief complaint of Ear Pain (right x 2-3 days)   Diagnosis and orders addressed:  1. Other non-recurrent acute nonsuppurative otitis media of right ear - Take meds as prescribed - Use a cool mist humidifier  -Use saline nose sprays frequently -Force fluids -For any cough or congestion  Use plain Mucinex- regular strength or max strength  is fine -For fever or aces or pains- take tylenol or ibuprofen. -Throat lozenges if help -RTO if symptoms worsen or do not improve  - amoxicillin (AMOXIL) 500 MG capsule; Take 2 capsules (1,000 mg total) by mouth 3 (three) times daily.  Dispense: 48 capsule; Refill: 0   Jannifer Rodneyhristy Hanifa Antonetti, FNP

## 2018-10-02 NOTE — Patient Instructions (Signed)

## 2018-10-22 IMAGING — CT CT MAXILLOFACIAL W/O CM
3 series · 16 of 47 positions shown, 19 images · non-contrast
Comparison: None.

CLINICAL DATA: Fall down stairs with facial trauma.

EXAM:
CT MAXILLOFACIAL WITHOUT CONTRAST
TECHNIQUE: Multidetector CT imaging of the maxillofacial structures was
performed. Multiplanar CT image reconstructions were also generated.

[Series 2: max soft · axial · 0.33mm/px · z∈[+1116,+1238]mm · 10 of 71 slices shown, 13 images]
[im 5/71  brain]
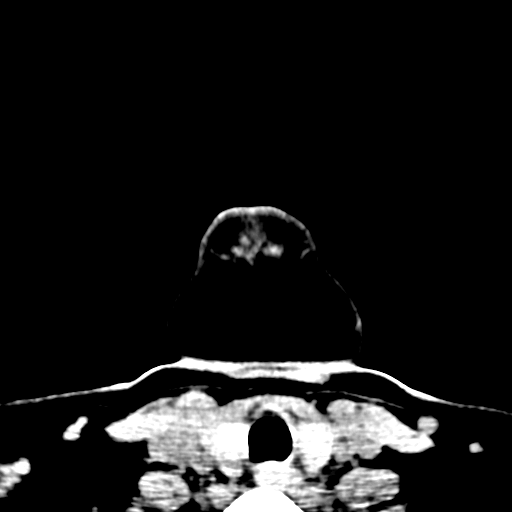
[im 5/71  bone]
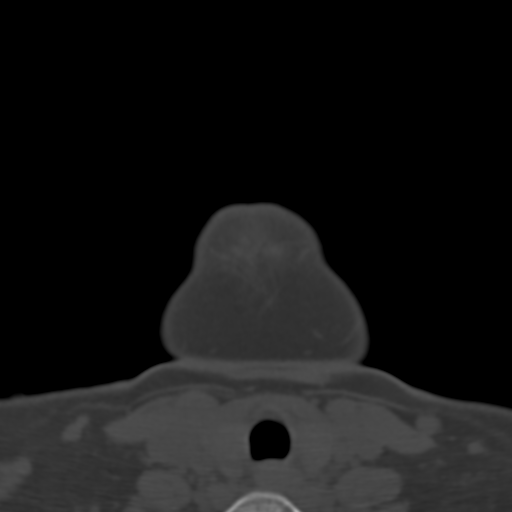
[im 13/71  bone]
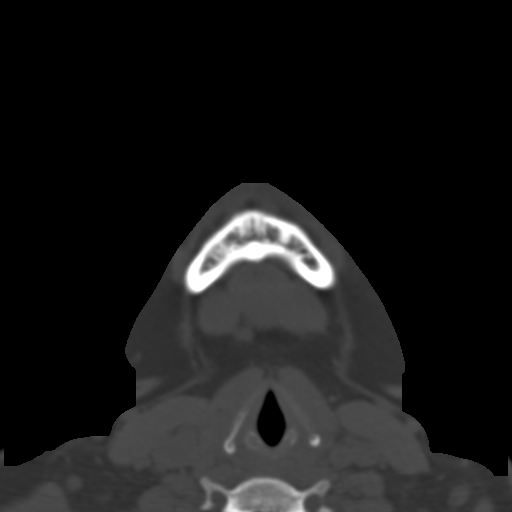
[im 20/71  bone]
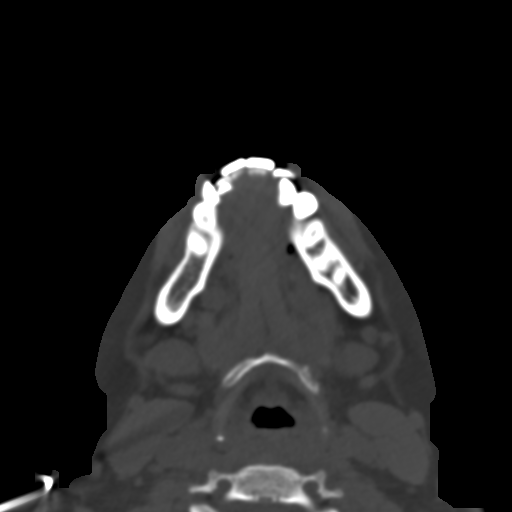
[im 25/71  bone]
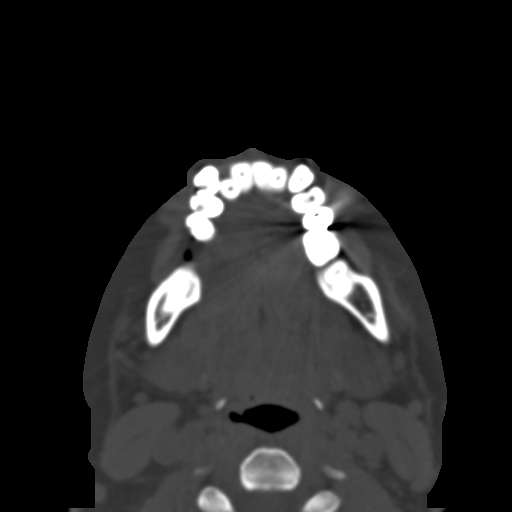
[im 32/71  brain]
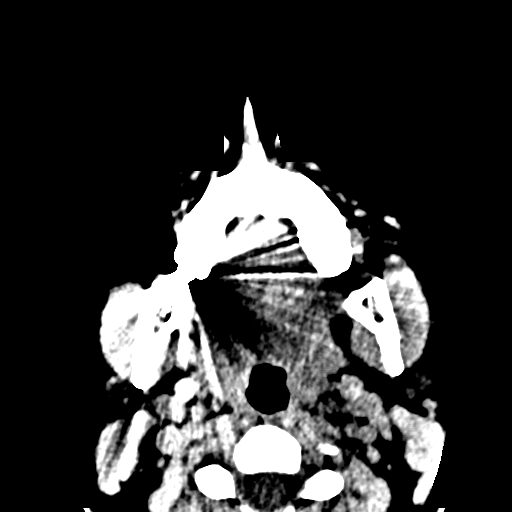
[im 32/71  bone]
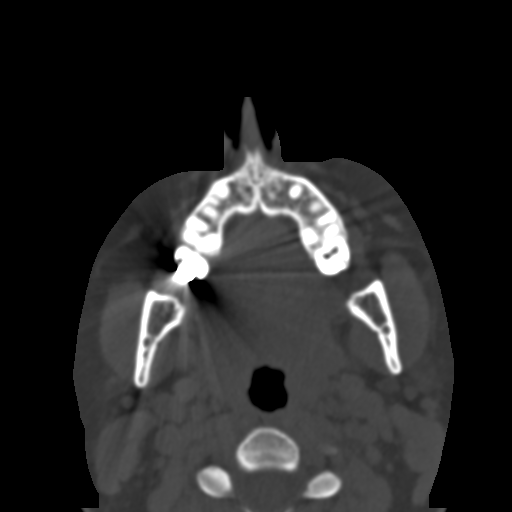
[im 39/71  bone]
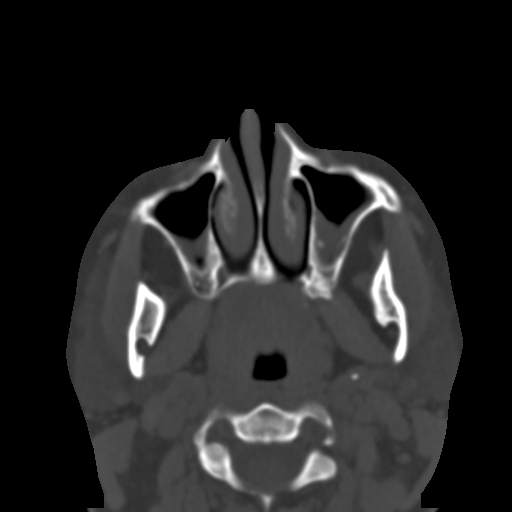
[im 46/71  bone]
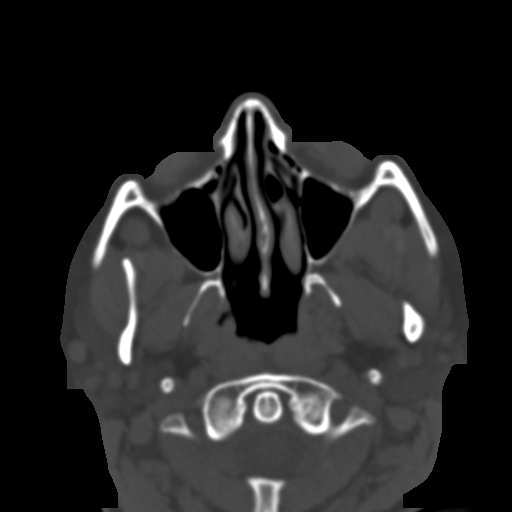
[im 54/71  bone]
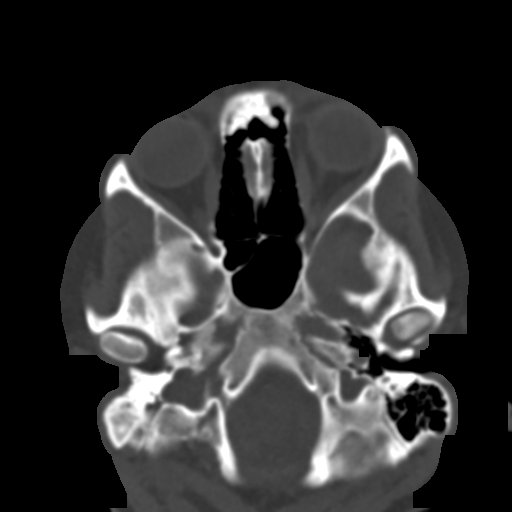
[im 58/71  brain]
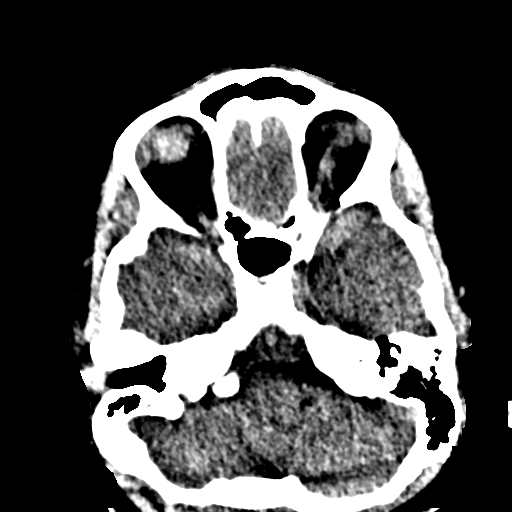
[im 58/71  bone]
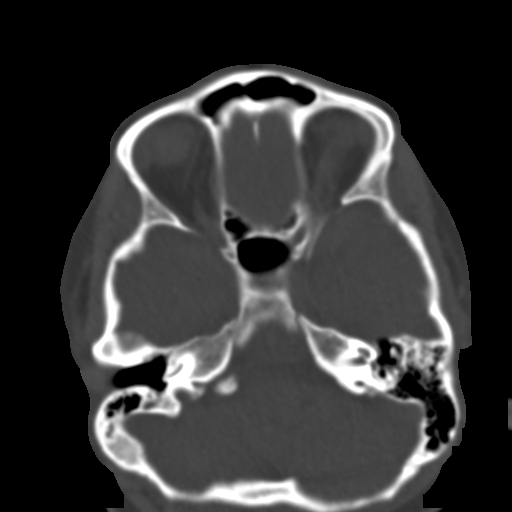
[im 66/71  bone]
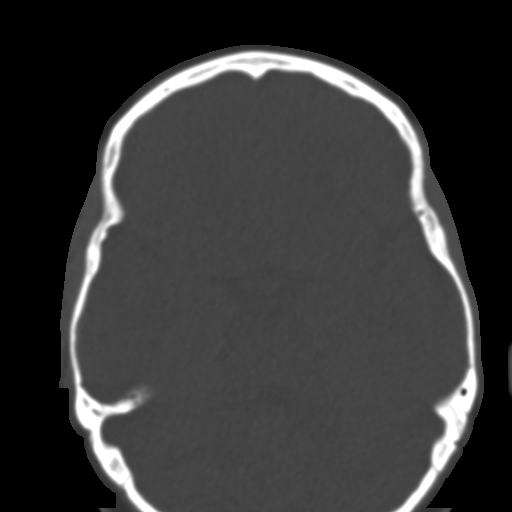

[Series 6: coronal soft · coronal · 0.29mm/px · 3 of 76 slices shown]
[im 26/76  bone]
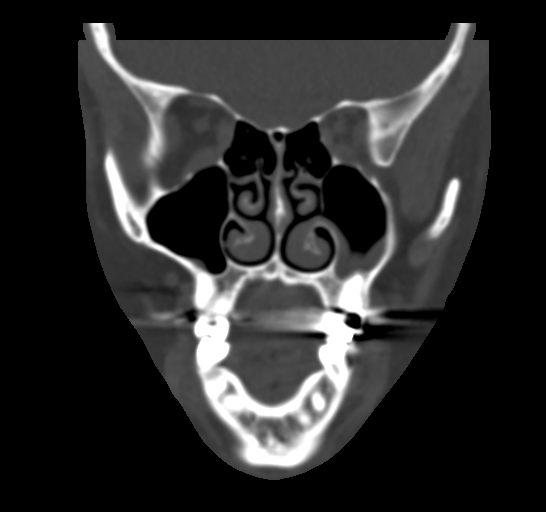
[im 34/76  bone]
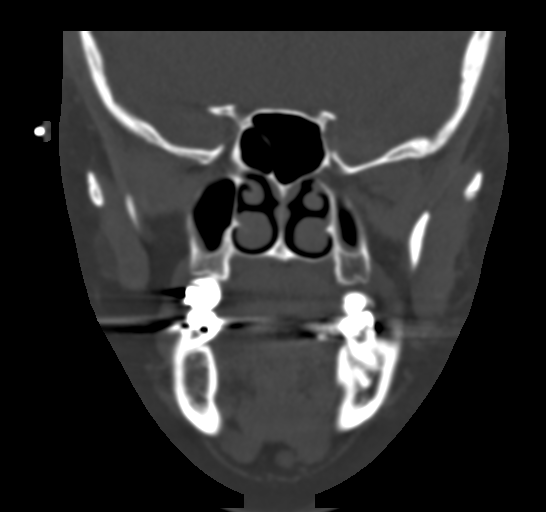
[im 42/76  bone]
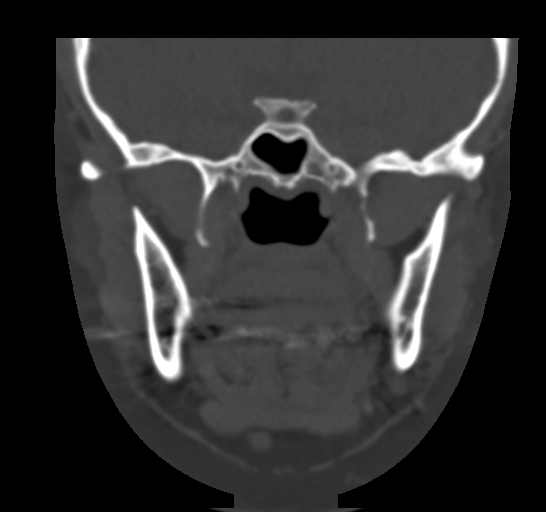

[Series 7: sagittal soft · sagittal · 0.28mm/px · 3 of 74 slices shown]
[im 25/74  bone]
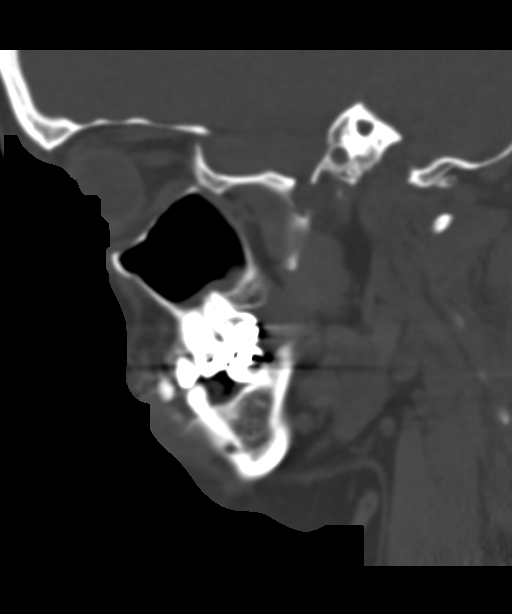
[im 37/74  bone]
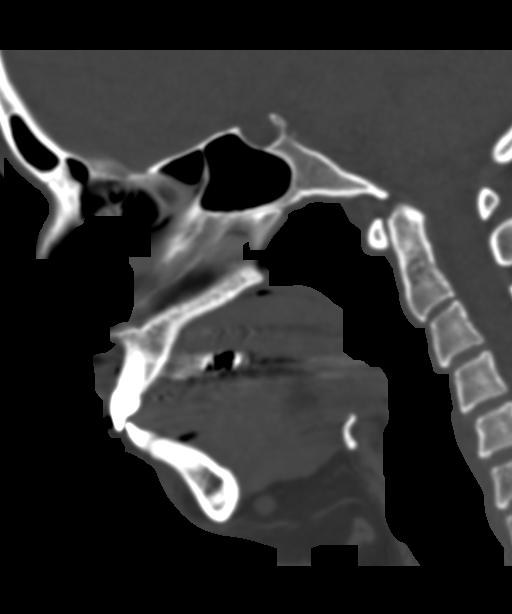
[im 49/74  bone]
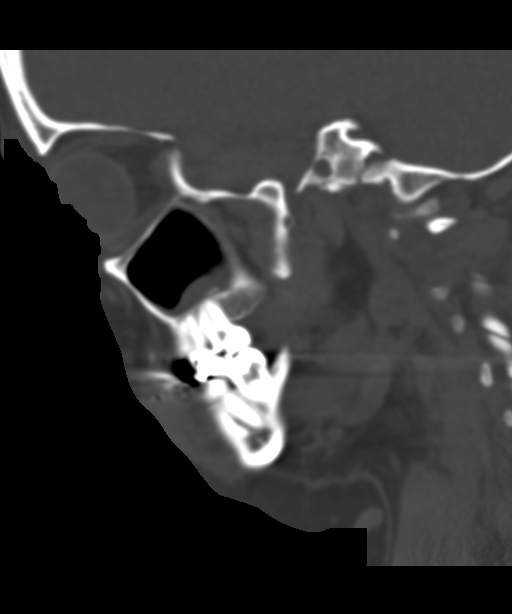

[16 of 47 positions shown; findings below may reference images not displayed]

FINDINGS: Osseous:

--Complex facial fracture types: No LeFort, zygomaticomaxillary
complex or nasoorbitoethmoidal fracture.

--Simple fracture types: None.

--Mandible, hard palate and teeth: Mallet occlusion of tooth 7
relative to tooth 23, of indeterminate chronicity. No fracture.

Orbits: The globes are intact. Normal appearance of the intra- and
extraconal fat. Symmetric extraocular muscles.

Sinuses: No fluid levels or advanced mucosal thickening.

Soft tissues: Normal visualized extracranial soft tissues.

Limited intracranial: Normal.
IMPRESSION: No facial fracture or other acute abnormality.

## 2019-06-27 ENCOUNTER — Other Ambulatory Visit: Payer: Self-pay

## 2019-06-27 ENCOUNTER — Ambulatory Visit (INDEPENDENT_AMBULATORY_CARE_PROVIDER_SITE_OTHER): Payer: Medicaid Other | Admitting: Family Medicine

## 2019-06-27 ENCOUNTER — Encounter: Payer: Self-pay | Admitting: Family Medicine

## 2019-06-27 VITALS — BP 119/82 | HR 65 | Temp 98.4°F | Resp 20 | Ht 62.5 in | Wt 154.0 lb

## 2019-06-27 DIAGNOSIS — M67431 Ganglion, right wrist: Secondary | ICD-10-CM

## 2019-06-27 NOTE — Patient Instructions (Signed)
Ganglion Cyst  A ganglion cyst is a non-cancerous, fluid-filled lump that occurs near a joint or tendon. The cyst grows out of a joint or the lining of a tendon. Ganglion cysts most often develop in the hand or wrist, but they can also develop in the shoulder, elbow, hip, knee, ankle, or foot. Ganglion cysts are ball-shaped or egg-shaped. Their size can range from the size of a pea to larger than a grape. Increased activity may cause the cyst to get bigger because more fluid starts to build up. What are the causes? The exact cause of this condition is not known, but it may be related to:  Inflammation or irritation around the joint.  An injury.  Repetitive movements or overuse.  Arthritis. What increases the risk? You are more likely to develop this condition if:  You are a woman.  You are 15-40 years old. What are the signs or symptoms? The main symptom of this condition is a lump. It most often appears on the hand or wrist. In many cases, there are no other symptoms, but a cyst can sometimes cause:  Tingling.  Pain.  Numbness.  Muscle weakness.  Weak grip.  Less range of motion in a joint. How is this diagnosed? Ganglion cysts are usually diagnosed based on a physical exam. Your health care provider will feel the lump and may shine a light next to it. If it is a ganglion cyst, the light will likely shine through it. Your health care provider may order an X-ray, ultrasound, or MRI to rule out other conditions. How is this treated? Ganglion cysts often go away on their own without treatment. If you have pain or other symptoms, treatment may be needed. Treatment is also needed if the ganglion cyst limits your movement or if it gets infected. Treatment may include:  Wearing a brace or splint on your wrist or finger.  Taking anti-inflammatory medicine.  Having fluid drained from the lump with a needle (aspiration).  Getting a steroid injected into the joint.  Having  surgery to remove the ganglion cyst.  Placing a pad on your shoe or wearing shoes that will not rub against the cyst if it is on your foot. Follow these instructions at home:  Do not press on the ganglion cyst, poke it with a needle, or hit it.  Take over-the-counter and prescription medicines only as told by your health care provider.  If you have a brace or splint: ? Wear it as told by your health care provider. ? Remove it as told by your health care provider. Ask if you need to remove it when you take a shower or a bath.  Watch your ganglion cyst for any changes.  Keep all follow-up visits as told by your health care provider. This is important. Contact a health care provider if:  Your ganglion cyst becomes larger or more painful.  You have pus coming from the lump.  You have weakness or numbness in the affected area.  You have a fever or chills. Get help right away if:  You have a fever and have any of these in the cyst area: ? Increased redness. ? Red streaks. ? Swelling. Summary  A ganglion cyst is a non-cancerous, fluid-filled lump that occurs near a joint or tendon.  Ganglion cysts most often develop in the hand or wrist, but they can also develop in the shoulder, elbow, hip, knee, ankle, or foot.  Ganglion cysts often go away on their own without treatment.   This information is not intended to replace advice given to you by your health care provider. Make sure you discuss any questions you have with your health care provider. Document Released: 09/24/2000 Document Revised: 09/09/2017 Document Reviewed: 05/27/2017 Elsevier Patient Education  2020 [employer]  

## 2019-06-27 NOTE — Progress Notes (Signed)
Subjective:  Patient ID: Isabella Little, female    DOB: 12/03/1990, 28 y.o.   MRN: [id number]  Patient Care Team: Sharion Balloon, FNP as PCP - General (Family Medicine)   Chief Complaint:  right wrist cyst   HPI: Isabella Little is a 28 y.o. female presenting on 06/27/2019 for right wrist cyst   Pt reports a knot to the top of her right wrist. Pt states this started about 2-3 weeks ago. Denies injury. States it is slightly uncomfortable with movement of wrist. No redness, drainage, fever, chills, loss of function, numbness of tingling.     Relevant past medical, surgical, family, and social history reviewed and updated as indicated.  Allergies and medications reviewed and updated. Date reviewed: Chart in Epic.   History reviewed. No pertinent past medical history.  Past Surgical History:  Procedure Laterality Date  . APPENDECTOMY    . DILATION AND CURETTAGE OF UTERUS    . TUBAL LIGATION      Social History   Socioeconomic History  . Marital status: Married    Spouse name: Not on file  . Number of children: Not on file  . Years of education: Not on file  . Highest education level: Not on file  Occupational History  . Not on file  Social Needs  . Financial resource strain: Not on file  . Food insecurity    Worry: Not on file    Inability: Not on file  . Transportation needs    Medical: Not on file    Non-medical: Not on file  Tobacco Use  . Smoking status: Never Smoker  . Smokeless tobacco: Never Used  Substance and Sexual Activity  . Alcohol use: No    Frequency: Never  . Drug use: No  . Sexual activity: Yes  Lifestyle  . Physical activity    Days per week: Not on file    Minutes per session: Not on file  . Stress: Not on file  Relationships  . Social Herbalist on phone: Not on file    Gets together: Not on file    Attends religious service: Not on file    Active member of club or organization: Not on file    Attends meetings of  clubs or organizations: Not on file    Relationship status: Not on file  . Intimate partner violence    Fear of current or ex partner: Not on file    Emotionally abused: Not on file    Physically abused: Not on file    Forced sexual activity: Not on file  Other Topics Concern  . Not on file  Social History Narrative  . Not on file    Outpatient Encounter Medications as of 06/27/2019  Medication Sig  . acetaminophen (TYLENOL) 325 MG tablet Take 650 mg by mouth every 6 (six) hours as needed.  . [DISCONTINUED] amoxicillin (AMOXIL) 500 MG capsule Take 2 capsules (1,000 mg total) by mouth 3 (three) times daily.   No facility-administered encounter medications on file as of 06/27/2019.     No Known Allergies  Review of Systems  Constitutional: Negative for activity change, appetite change, chills, fatigue and fever.  HENT: Negative.   Eyes: Negative.   Respiratory: Negative for cough, chest tightness and shortness of breath.   Cardiovascular: Negative for chest pain, palpitations and leg swelling.  Gastrointestinal: Negative for blood in stool, constipation, diarrhea, nausea and vomiting.  Endocrine: Negative.   Genitourinary: Negative for dysuria,  frequency and urgency.  Musculoskeletal: Negative for arthralgias and myalgias.       Cyst / knot to right wrist   Skin:       Knot / cyst to right wrist  Allergic/Immunologic: Negative.   Neurological: Negative for dizziness and headaches.  Hematological: Negative.   Psychiatric/Behavioral: Negative for confusion, hallucinations, sleep disturbance and suicidal ideas.  All other systems reviewed and are negative.       Objective:  BP 119/82   Pulse 65   Temp 98.4 F (36.9 C)   Resp 20   Ht 5' 2.5" (1.588 m)   Wt 154 lb (69.9 kg)   LMP 04/26/2019 Comment: irregular  SpO2 99%   BMI 27.72 kg/m    Wt Readings from Last 3 Encounters:  06/27/19 154 lb (69.9 kg)  10/02/18 148 lb (67.1 kg)  07/19/18 150 lb (68 kg)     Physical Exam Vitals signs and nursing note reviewed.  Constitutional:      General: She is not in acute distress.    Appearance: Normal appearance. She is well-developed and well-groomed. She is not ill-appearing, toxic-appearing or diaphoretic.  HENT:     Head: Normocephalic and atraumatic.     Jaw: There is normal jaw occlusion.     Right Ear: Hearing normal.     Left Ear: Hearing normal.     Nose: Nose normal.     Mouth/Throat:     Lips: Pink.     Mouth: Mucous membranes are moist.     Pharynx: Oropharynx is clear. Uvula midline.  Eyes:     General: Lids are normal.     Extraocular Movements: Extraocular movements intact.     Conjunctiva/sclera: Conjunctivae normal.     Pupils: Pupils are equal, round, and reactive to light.  Neck:     Musculoskeletal: Normal range of motion and neck supple.     Thyroid: No thyroid mass, thyromegaly or thyroid tenderness.     Vascular: No carotid bruit or JVD.     Trachea: Trachea and phonation normal.  Cardiovascular:     Rate and Rhythm: Normal rate and regular rhythm.     Chest Wall: PMI is not displaced.     Pulses: Normal pulses.     Heart sounds: Normal heart sounds. No murmur. No friction rub. No gallop.   Pulmonary:     Effort: Pulmonary effort is normal. No respiratory distress.     Breath sounds: Normal breath sounds. No wheezing.  Abdominal:     General: Bowel sounds are normal. There is no distension or abdominal bruit.     Palpations: Abdomen is soft. There is no hepatomegaly or splenomegaly.     Tenderness: There is no abdominal tenderness. There is no right CVA tenderness or left CVA tenderness.     Hernia: No hernia is present.  Musculoskeletal: Normal range of motion.     Right elbow: Normal.    Right wrist: She exhibits tenderness. She exhibits normal range of motion, no bony tenderness, no swelling, no effusion, no crepitus, no deformity and no laceration.       Arms:     Right hand: Normal.     Right lower leg: No  edema.     Left lower leg: No edema.  Lymphadenopathy:     Cervical: No cervical adenopathy.  Skin:    General: Skin is warm and dry.     Capillary Refill: Capillary refill takes less than 2 seconds.     Coloration: Skin  is not cyanotic, jaundiced or pale.     Findings: No rash.  Neurological:     General: No focal deficit present.     Mental Status: She is alert and oriented to person, place, and time.     Cranial Nerves: Cranial nerves are intact.     Sensory: Sensation is intact.     Motor: Motor function is intact.     Coordination: Coordination is intact.     Gait: Gait is intact.     Deep Tendon Reflexes: Reflexes are normal and symmetric.  Psychiatric:        Attention and Perception: Attention and perception normal.        Mood and Affect: Mood and affect normal.        Speech: Speech normal.        Behavior: Behavior normal. Behavior is cooperative.        Thought Content: Thought content normal.        Cognition and Memory: Cognition and memory normal.        Judgment: Judgment normal.     Results for orders placed or performed in visit on 12/15/17  Culture, Group A Strep   Specimen: Throat   TH  Result Value Ref Range   Strep A Culture Comment (A)   Rapid Strep Screen (Not at [facility])   Specimen: Other   OTHER  Result Value Ref Range   Strep Gp A Ag, IA W/Reflex Negative Negative  Culture, Group A Strep   OTHER  Result Value Ref Range   Strep A Culture CANCELED        Pertinent labs & imaging results that were available during my care of the patient were reviewed by me and considered in my medical decision making.  Assessment & Plan:  Isabella AbuKaylee was seen today for right wrist cyst.  Diagnoses and all orders for this visit:  Ganglion of right wrist Offered needle aspiration of cyst or steroid injection. Pt declined steroid and aspiration of cyst in office. Pt instructed to take Aleve twice daily for 2 weeks. Wrist splint applied in office for comfort.  Symptomatic care discussed. May require referral to ortho if symptoms persist or worsen.     Continue all other maintenance medications.  Follow up plan: Return if symptoms worsen or fail to improve.  Continue healthy lifestyle choices, including diet (rich in fruits, vegetables, and lean proteins, and low in salt and simple carbohydrates) and exercise (at least 30 minutes of moderate physical activity daily).  Educational handout given for ganglion cyst  The above assessment and management plan was discussed with the patient. The patient verbalized understanding of and has agreed to the management plan. Patient is aware to call the clinic if they develop any new symptoms or if symptoms persist or worsen. Patient is aware when to return to the clinic for a follow-up visit. Patient educated on when it is appropriate to go to the emergency department.   Isabella BaarsMichelle Rakes, FNP-C [facility] [phone]

## 2020-08-09 ENCOUNTER — Emergency Department (HOSPITAL_COMMUNITY)
Admission: EM | Admit: 2020-08-09 | Discharge: 2020-08-09 | Disposition: A | Discharge status: Home / Self Care | Payer: Medicaid Other | Attending: Emergency Medicine | Admitting: Emergency Medicine

## 2020-08-09 ENCOUNTER — Encounter (HOSPITAL_COMMUNITY): Payer: Self-pay | Admitting: *Deleted

## 2020-08-09 ENCOUNTER — Other Ambulatory Visit: Payer: Self-pay

## 2020-08-09 DIAGNOSIS — J40 Bronchitis, not specified as acute or chronic: Secondary | ICD-10-CM | POA: Diagnosis not present

## 2020-08-09 DIAGNOSIS — J209 Acute bronchitis, unspecified: Secondary | ICD-10-CM | POA: Diagnosis not present

## 2020-08-09 DIAGNOSIS — Z20822 Contact with and (suspected) exposure to covid-19: Secondary | ICD-10-CM | POA: Diagnosis not present

## 2020-08-09 DIAGNOSIS — H9201 Otalgia, right ear: Secondary | ICD-10-CM | POA: Diagnosis present

## 2020-08-09 DIAGNOSIS — H6690 Otitis media, unspecified, unspecified ear: Secondary | ICD-10-CM

## 2020-08-09 DIAGNOSIS — H6691 Otitis media, unspecified, right ear: Secondary | ICD-10-CM | POA: Insufficient documentation

## 2020-08-09 LAB — RESPIRATORY PANEL BY RT PCR (FLU A&B, COVID)
Influenza A by PCR: NEGATIVE
Influenza B by PCR: NEGATIVE
SARS Coronavirus 2 by RT PCR: NEGATIVE

## 2020-08-09 MED ORDER — AMOXICILLIN 500 MG PO CAPS: 500.0000 mg | ORAL_CAPSULE | Freq: Three times a day (TID) | ORAL | 0 refills | Status: AC

## 2020-08-09 MED ORDER — AZITHROMYCIN 250 MG PO TABS: ORAL_TABLET | ORAL | 0 refills | Status: AC

## 2020-08-09 MED ORDER — PREDNISONE 20 MG PO TABS: ORAL_TABLET | ORAL | 0 refills | Status: AC

## 2020-08-09 NOTE — Discharge Instructions (Addendum)
Drink plenty of fluids.  Take Mucinex DM over-the-counter for cough.  Take the antibiotics as prescribed.  Let your primary care doctor know if your ear pain is not improved over the weekend.  If your Covid test is positive get the prescription for prednisone filled.  Also start taking zinc 50 mg a day, vitamin C and vitamin D, and aspirin 81 mg a day.  Also you could take zinc daily anyway to help your body fight viral infections.  If your Covid test is positive return to the emergency room if you start getting short of breath and struggle to breathe.

## 2020-08-09 NOTE — ED Provider Notes (Signed)
[facility] EMERGENCY DEPARTMENT Provider Note   CSN: [id number] Arrival date & time: 08/09/20  0022   Time seen 1:02 AM  History Chief Complaint  Patient presents with  . Otalgia    Isabella Little is a 29 y.o. female.  HPI Patient is here and states about 11 PM tonight she started having right ear pain.  She has had a cough for the past 1-1/2 weeks and a sore throat but denies fever, change in her sense of loss of taste or smell, chest pain, shortness of breath, abdominal pain, nausea, vomiting, or diarrhea.  She has not been around anybody else who is sick however her young daughter is here with a right earache also that started today.  She states stays at home and is not exposed to the public.  She did not take the Covid vaccine  PCP Junie Spencer, FNP     History reviewed. No pertinent past medical history.  There are no problems to display for this patient.   Past Surgical History:  Procedure Laterality Date  . APPENDECTOMY    . DILATION AND CURETTAGE OF UTERUS    . TUBAL LIGATION       OB History   No obstetric history on file.     Family History  Problem Relation Age of Onset  . Stroke Father   . Heart disease Father        Triple by-pass  . Diabetes Maternal Grandmother   . Heart disease Maternal Grandmother   . Cancer Maternal Grandmother        Skin  . Birth defects Maternal Grandmother   . Cancer Paternal Grandmother        breast    Social History   Tobacco Use  . Smoking status: Never Smoker  . Smokeless tobacco: Never Used  Vaping Use  . Vaping Use: Never used  Substance Use Topics  . Alcohol use: No  . Drug use: No    Home Medications Prior to Admission medications   Medication Sig Start Date End Date Taking? Authorizing Provider  acetaminophen (TYLENOL) 325 MG tablet Take 650 mg by mouth every 6 (six) hours as needed.    [provider]  amoxicillin (AMOXIL) 500 MG capsule Take 1 capsule (500 mg total) by mouth 3  (three) times daily. 08/09/20   Devoria Albe, MD  azithromycin (ZITHROMAX) 250 MG tablet Take 2 po the first day then once a day for the next 4 days. 08/09/20   Devoria Albe, MD  predniSONE (DELTASONE) 20 MG tablet Take 3 po QD x 3d , then 2 po QD x 3d then 1 po QD x 3d 08/09/20   Devoria Albe, MD    Allergies    Patient has no known allergies.  Review of Systems   Review of Systems  All other systems reviewed and are negative.   Physical Exam Updated Vital Signs BP (!) 133/91 (BP Location: Right Arm)   Pulse 91   Temp 98.8 F (37.1 C) (Oral)   Resp 18   Ht 5' 2.5" (1.588 m)   Wt 70.8 kg   SpO2 95%   BMI 28.08 kg/m   Physical Exam Vitals and nursing note reviewed.  Constitutional:      General: She is not in acute distress.    Appearance: Normal appearance. She is normal weight.  HENT:     Head: Normocephalic and atraumatic.     Right Ear: Ear canal and external ear normal.  Left Ear: Tympanic membrane, ear canal and external ear normal.     Ears:     Comments: The right TM is very opaque and has some erythema especially inferiorly.    Nose: Nose normal.     Mouth/Throat:     Mouth: Mucous membranes are moist.     Pharynx: No oropharyngeal exudate or posterior oropharyngeal erythema.     Comments: Mildly enlarged tonsils Eyes:     Extraocular Movements: Extraocular movements intact.     Conjunctiva/sclera: Conjunctivae normal.     Pupils: Pupils are equal, round, and reactive to light.  Cardiovascular:     Rate and Rhythm: Normal rate and regular rhythm.     Pulses: Normal pulses.     Heart sounds: Normal heart sounds.  Pulmonary:     Effort: Pulmonary effort is normal.     Breath sounds: Normal breath sounds. No wheezing, rhonchi or rales.     Comments: Coughing frequently Musculoskeletal:        General: Normal range of motion.     Cervical back: Normal range of motion.  Skin:    General: Skin is warm and dry.     Findings: No rash.  Neurological:      General: No focal deficit present.     Mental Status: She is alert and oriented to person, place, and time.     Cranial Nerves: No cranial nerve deficit.  Psychiatric:        Mood and Affect: Mood normal.        Behavior: Behavior normal.        Thought Content: Thought content normal.     ED Results / Procedures / Treatments   Labs (all labs ordered are listed, but only abnormal results are displayed) Results for orders placed or performed during the hospital encounter of 08/09/20  Respiratory Panel by RT PCR (Flu A&B, Covid) - Nasopharyngeal Swab   Specimen: Nasopharyngeal Swab  Result Value Ref Range   SARS Coronavirus 2 by RT PCR NEGATIVE NEGATIVE   Influenza A by PCR NEGATIVE NEGATIVE   Influenza B by PCR NEGATIVE NEGATIVE      EKG None  Radiology No results found.  Procedures Procedures (including critical care time)  Medications Ordered in ED Medications - No data to display  ED Course  I have reviewed the triage vital signs and the nursing notes.  Pertinent labs & imaging results that were available during my care of the patient were reviewed by me and considered in my medical decision making (see chart for details).    MDM Rules/Calculators/A&P                          Patient is interested in being tested for Covid.  Her pulse ox is normal.  Chest x-ray was not done.  If her Covid test is positive she was given a prescription of prednisone to get filled otherwise she can fill the antibiotics.  5:00 AM patient's Covid test and influenza test are negative.  Final Clinical Impression(s) / ED Diagnoses Final diagnoses:  Acute otitis media, unspecified otitis media type  Bronchitis    Rx / DC Orders ED Discharge Orders         Ordered    amoxicillin (AMOXIL) 500 MG capsule  3 times daily        08/09/20 0127    azithromycin (ZITHROMAX) 250 MG tablet        08/09/20 0127  predniSONE (DELTASONE) 20 MG tablet        08/09/20 0127         Plan  discharge  Devoria Albe, MD, Concha Pyo, MD 08/09/20 [phone]

## 2020-08-09 NOTE — ED Triage Notes (Signed)
Pt c/o right ear pain that started tonight

## 2020-08-11 ENCOUNTER — Telehealth: Payer: Self-pay

## 2020-08-11 NOTE — Telephone Encounter (Signed)
Transition Care Management Follow-up Telephone Call  Date of discharge and from where: 08/09/2020 from [facility]  How have you been since you were released from the hospital? Patient states that her ear pain and cough are doing much better.   Any questions or concerns? No  Items Reviewed:  Did the pt receive and understand the discharge instructions provided? Yes   Medications obtained and verified? Yes   Other? No   Any new allergies since your discharge? No   Dietary orders reviewed? Yes  Do you have support at home? Yes   Functional Questionnaire: (I = Independent and D = Dependent) ADLs: I  Bathing/Dressing- I  Meal Prep- I  Eating- I  Maintaining continence- I  Transferring/Ambulation- I  Managing Meds- I   Follow up appointments reviewed:   PCP Hospital f/u appt confirmed? Patient stated that she will call PCP to schedule her appointment to follow up.   Are transportation arrangements needed? No   If their condition worsens, is the pt aware to call PCP or go to the Emergency Dept.? Yes  Was the patient provided with contact information for the PCP's office or ED? Yes  Was to pt encouraged to call back with questions or concerns? YES

## 2020-11-03 ENCOUNTER — Other Ambulatory Visit: Payer: Self-pay | Admitting: Internal Medicine

## 2020-11-03 ENCOUNTER — Other Ambulatory Visit: Payer: Medicaid Other

## 2020-11-03 DIAGNOSIS — Z20822 Contact with and (suspected) exposure to covid-19: Secondary | ICD-10-CM | POA: Diagnosis not present

## 2020-11-04 LAB — SPECIMEN STATUS REPORT

## 2020-11-04 LAB — SARS-COV-2, NAA 2 DAY TAT

## 2020-11-04 LAB — NOVEL CORONAVIRUS, NAA: SARS-CoV-2, NAA: NOT DETECTED

## 2020-11-05 ENCOUNTER — Telehealth: Payer: Self-pay | Admitting: *Deleted

## 2020-11-05 NOTE — Telephone Encounter (Signed)
Pt notified of negative COVID-19 results. Understanding verbalized.
